# Patient Record
Sex: Male | Born: 2000 | Race: Black or African American | Hispanic: No | Marital: Single | Smoking: Never smoker
Health system: Southern US, Community
[De-identification: ages and names within clinical notes are randomized; demographics above are authoritative.]

## PROBLEM LIST (undated history)

## (undated) DIAGNOSIS — R000 Tachycardia, unspecified: Secondary | ICD-10-CM

## (undated) DIAGNOSIS — K219 Gastro-esophageal reflux disease without esophagitis: Secondary | ICD-10-CM

---
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
## 2020-03-23 ENCOUNTER — Ambulatory Visit: Payer: Medicaid Other | Admitting: Family Medicine

## 2020-03-24 ENCOUNTER — Encounter: Payer: Self-pay | Admitting: Family Medicine

## 2020-05-25 ENCOUNTER — Ambulatory Visit (INDEPENDENT_AMBULATORY_CARE_PROVIDER_SITE_OTHER): Payer: BC Managed Care – PPO | Admitting: Family Medicine

## 2020-05-25 ENCOUNTER — Encounter: Payer: Self-pay | Admitting: Family Medicine

## 2020-05-25 ENCOUNTER — Other Ambulatory Visit: Payer: Self-pay

## 2020-05-25 VITALS — BP 114/64 | HR 61 | Temp 97.9°F | Ht 66.5 in | Wt 155.2 lb

## 2020-05-25 DIAGNOSIS — M545 Low back pain: Secondary | ICD-10-CM

## 2020-05-25 DIAGNOSIS — G8929 Other chronic pain: Secondary | ICD-10-CM

## 2020-05-25 DIAGNOSIS — E739 Lactose intolerance, unspecified: Secondary | ICD-10-CM

## 2020-05-25 DIAGNOSIS — N50812 Left testicular pain: Secondary | ICD-10-CM

## 2020-05-25 DIAGNOSIS — K219 Gastro-esophageal reflux disease without esophagitis: Secondary | ICD-10-CM | POA: Diagnosis not present

## 2020-05-25 DIAGNOSIS — J4520 Mild intermittent asthma, uncomplicated: Secondary | ICD-10-CM | POA: Diagnosis not present

## 2020-05-25 MED ORDER — LANSOPRAZOLE 15 MG PO CPDR: 15.0000 mg | DELAYED_RELEASE_CAPSULE | Freq: Every day | ORAL | 3 refills | Status: DC

## 2020-05-25 MED ORDER — ALBUTEROL SULFATE HFA 108 (90 BASE) MCG/ACT IN AERS: 2.0000 | INHALATION_SPRAY | RESPIRATORY_TRACT | 5 refills | Status: DC | PRN

## 2020-05-25 MED ORDER — CETIRIZINE HCL 10 MG PO TABS: 10.0000 mg | ORAL_TABLET | Freq: Every day | ORAL | 3 refills | Status: DC

## 2020-05-25 NOTE — Patient Instructions (Addendum)
Acute Back Pain, Adult Acute back pain is sudden and usually short-lived. It is often caused by an injury to the muscles and tissues in the back. The injury may result from:  A muscle or ligament getting overstretched or torn (strained). Ligaments are tissues that connect bones to each other. Lifting something improperly can cause a back strain.  Wear and tear (degeneration) of the spinal disks. Spinal disks are circular tissue that provides cushioning between the bones of the spine (vertebrae).  Twisting motions, such as while playing sports or doing yard work.  A hit to the back.  Arthritis. You may have a physical exam, lab tests, and imaging tests to find the cause of your pain. Acute back pain usually goes away with rest and home care. Follow these instructions at home: Managing pain, stiffness, and swelling  Take over-the-counter and prescription medicines only as told by your health care provider.  Your health care provider may recommend applying ice during the first 24-48 hours after your pain starts. To do this: ? Put ice in a plastic bag. ? Place a towel between your skin and the bag. ? Leave the ice on for 20 minutes, 2-3 times a day.  If directed, apply heat to the affected area as often as told by your health care provider. Use the heat source that your health care provider recommends, such as a moist heat pack or a heating pad. ? Place a towel between your skin and the heat source. ? Leave the heat on for 20-30 minutes. ? Remove the heat if your skin turns bright red. This is especially important if you are unable to feel pain, heat, or cold. You have a greater risk of getting burned. Activity   Do not stay in bed. Staying in bed for more than 1-2 days can delay your recovery.  Sit up and stand up straight. Avoid leaning forward when you sit, or hunching over when you stand. ? If you work at a desk, sit close to it so you do not need to lean over. Keep your chin tucked  in. Keep your neck drawn back, and keep your elbows bent at a right angle. Your arms should look like the letter "L." ? Sit high and close to the steering wheel when you drive. Add lower back (lumbar) support to your car seat, if needed.  Take short walks on even surfaces as soon as you are able. Try to increase the length of time you walk each day.  Do not sit, drive, or stand in one place for more than 30 minutes at a time. Sitting or standing for long periods of time can put stress on your back.  Do not drive or use heavy machinery while taking prescription pain medicine.  Use proper lifting techniques. When you bend and lift, use positions that put less stress on your back: ? Bend your knees. ? Keep the load close to your body. ? Avoid twisting.  Exercise regularly as told by your health care provider. Exercising helps your back heal faster and helps prevent back injuries by keeping muscles strong and flexible.  Work with a physical therapist to make a safe exercise program, as recommended by your health care provider. Do any exercises as told by your physical therapist. Lifestyle  Maintain a healthy weight. Extra weight puts stress on your back and makes it difficult to have good posture.  Avoid activities or situations that make you feel anxious or stressed. Stress and anxiety increase muscle   tension and can make back pain worse. Learn ways to manage anxiety and stress, such as through exercise. General instructions  Sleep on a firm mattress in a comfortable position. Try lying on your side with your knees slightly bent. If you lie on your back, put a pillow under your knees.  Follow your treatment plan as told by your health care provider. This may include: ? Cognitive or behavioral therapy. ? Acupuncture or massage therapy. ? Meditation or yoga. Contact a health care provider if:  You have pain that is not relieved with rest or medicine.  You have increasing pain going down  into your legs or buttocks.  Your pain does not improve after 2 weeks.  You have pain at night.  You lose weight without trying.  You have a fever or chills. Get help right away if:  You develop new bowel or bladder control problems.  You have unusual weakness or numbness in your arms or legs.  You develop nausea or vomiting.  You develop abdominal pain.  You feel faint. Summary  Acute back pain is sudden and usually short-lived.  Use proper lifting techniques. When you bend and lift, use positions that put less stress on your back.  Take over-the-counter and prescription medicines and apply heat or ice as directed by your health care provider. This information is not intended to replace advice given to you by your health care provider. Make sure you discuss any questions you have with your health care provider. Document Revised: 12/08/2018 Document Reviewed: 04/02/2017 Elsevier Patient Education  2020 [employer] Lactose Intolerance, Adult Lactose is a natural sugar that is found in dairy milk and dairy products such as cheese and yogurt. Lactose is digested by lactase, a protein (enzyme) in your small intestine. Some people do not produce enough lactase to digest lactose. This is called lactose intolerance. Lactose intolerance is different from milk allergy, which is a more serious reaction to the protein in milk. What are the causes? Causes of lactose intolerance may include:  Normal aging. The ability to produce lactase may lessen with age, causing lactose intolerance over time.  Being born without the ability to make lactase.  Digestive diseases such as gastroenteritis or inflammatory bowel disease (IBD).  Surgery or injury to your small intestine.  Infection in your intestines.  Certain antibiotic medicines and cancer treatments. What are the signs or symptoms? Lactose intolerance can cause discomfort within 30 minutes to 2 hours after you eat or drink  something that contains lactose. Symptoms may include:  Nausea.  Diarrhea.  Cramps or pain in the abdomen.  A full, tight, or painful feeling in the abdomen (bloating).  Gas. How is this diagnosed? This condition may be diagnosed based on:  Your symptoms and medical history.  Lactose tolerance test. This test involves drinking a lactose solution and then having blood tests to measure the amount of glucose in your blood. If your blood glucose level does not go up, it means your body is not able to digest the lactose.  Lactose breath test (hydrogen breath test). This test involves drinking a lactose solution and then exhaling into a type of bag while you digest the solution. Having a lot of hydrogen in your breath can be a sign of lactose intolerance.  Stool acidity test. This involves drinking a lactose solution and then having your stool samples tested for bacteria. Having a lot of bacteria causes stool to be considered acidic, which is a sign of lactose intolerance. How  is this treated? There is no treatment to improve your body's ability to produce lactase. However, you can manage your symptoms at home by:  Limiting or avoiding dairy milk, dairy products, and other sources of lactose.  Taking lactase tablets when you eat milk products. Lactase tablets are over-the-counter medicines that help to improve lactose digestion. You may also add lactase drops to regular milk.  Adjusting your diet, such as drinking lactose-free milk. Lactose tolerance varies from person to person. Some people may be able to eat or drink small amounts of products that contain lactose, and other people may need to avoid all foods and drinks that contain lactose. Talk with your health care provider about what treatment is best for you. Follow these instructions at home:  Limit or avoid foods, beverages, and medicines that contain lactose, as told by your health care provider. Keep track of which foods,  beverages, or medicines cause symptoms so you can decide what to avoid in the future.  Read food and medicine labels carefully. Avoid products that contain: ? Lactose. ? Milk solids. ? Casein. ? Whey.  Take over-the-counter and prescription medicines (including lactase tablets) only as told by your health care provider.  If you stop eating and drinking dairy products (eliminate dairy from your diet), make sure to get enough protein, calcium, and vitamin D from other foods. Work with your health care provider or a diet and nutrition specialist (dietitian) to make sure you get enough of those nutrients.  Choose a milk substitute that is fortified with calcium and vitamin D. ? Soy milk contains high-quality protein. ? Milks that are made from nuts or grains (such as almond milk and rice milk) contain very small amounts of protein.  Keep all follow-up visits as told by your health care provider. This is important. Contact a health care provider if:  You have no relief from your symptoms after you have eliminated milk products and other sources of lactose. Get help right away if:  You have blood in your stool.  You have severe abdomen (abdominal) pain. Summary  Lactose is a natural sugar that is found in dairy milk and dairy products such as cheese and yogurt. Lactose is digested by lactase, which is a protein (enzyme) in the small intestine.  Some people do not produce enough lactase to digest lactose. This is called lactose intolerance.  Lactose intolerance can cause discomfort within 30 minutes to 2 hours after you eat or drink something that contains lactose.  Limit or avoid foods, beverages, and medicines that contain lactose, as told by your health care provider. This information is not intended to replace advice given to you by your health care provider. Make sure you discuss any questions you have with your health care provider. Document Revised: 08/01/2017 Document Reviewed:  04/04/2017 Elsevier Patient Education  2020 [employer].

## 2020-05-25 NOTE — Progress Notes (Signed)
Subjective: CC: back pain, GERD, asthma PCP: Mechele Claude, MD  MWN:[id number] A Benedick is a 19 y.o. male presenting to clinic today for:  1. Back pain Jacob Walker reports lower left sided back pain that occurs after eating dairy. He described the pain as a dull ache. The pain varies from mild to moderated. The pain is relieved with a bowel movement and/or passing gas. He denies stomach pain, nausea, vomiting, diarrhea, or constipation. He also reports left testicular pain that occurs with with the back pain. He is unable to describe the testicular pain, but report that it is milder than his back pain. He denies dysuria, frequency, or urgency, blood in urine, testicular swelling, penile pain or discharge. Denies fever. He is not sexually active. He reports that this has been going on for years and he was diagnosed as lactose intolerance as a child. He has been eating more daily lately so he has noticed an increase in symptoms. He has not tried lactaid tablets. He also has started working for [employer] and does a lot of lifting.  2. Acid reflux Jacob Walker reports that he used to take Prevacid that controlled his symptoms well, but has not taken the medication for the last two years. He reports indigestion, gas, and belching. Denies stomach acid, heartburn, or blood in his stool. Denies hoarseness, cough, or trouble swallowing.   3. Asthma Jacob Walker was dx with asthma as a child. He has not had an inhaler for awhile. He reports occasional shortness of breath that he feels is related to his allergies. He used to take Zyrtec but has not for a few years. He denies wheezing, chest tightness, or cough. Denies chest pain.   Relevant past medical, surgical, family, and social history reviewed and updated as indicated.  Allergies and medications reviewed and updated.  Allergies  Allergen Reactions  . Bee Venom Hives  . Lactose Intolerance (Gi)    Past Medical History:  Diagnosis Date  . Asthma   . GERD  (gastroesophageal reflux disease)   . Rapid heart beat     Current Outpatient Medications:  .  albuterol (VENTOLIN HFA) 108 (90 Base) MCG/ACT inhaler, Inhale 2 puffs into the lungs daily., Disp: 18 g, Rfl: 5 .  EPINEPHrine 0.3 mg/0.3 mL IJ SOAJ injection, Inject 0.3 mLs (0.3 mg total) into the muscle as needed for anaphylaxis., Disp: 2 Device, Rfl: 3 .  fluticasone (FLOVENT HFA) 44 MCG/ACT inhaler, Inhale 2 puffs into the lungs 2 (two) times a day., Disp: 1 Inhaler, Rfl: 12 .  lansoprazole (PREVACID) 15 MG capsule, Take 1 capsule (15 mg total) by mouth daily. (Patient not taking: Reported on 05/25/2020), Disp: 90 capsule, Rfl: 3 Social History   Socioeconomic History  . Marital status: Single    Spouse name: Not on file  . Number of children: Not on file  . Years of education: Not on file  . Highest education level: Not on file  Occupational History  . Not on file  Tobacco Use  . Smoking status: Passive Smoke Exposure - Never Smoker  . Smokeless tobacco: Never Used  Vaping Use  . Vaping Use: Never used  Substance and Sexual Activity  . Alcohol use: No  . Drug use: No  . Sexual activity: Not Currently  Other Topics Concern  . Not on file  Social History Narrative  . Not on file   Social Determinants of Health   Financial Resource Strain:   . Difficulty of Paying Living Expenses: Not on file  Food Insecurity:   . Worried About Programme researcher, broadcasting/film/video in the Last Year: Not on file  . Ran Out of Food in the Last Year: Not on file  Transportation Needs:   . Lack of Transportation (Medical): Not on file  . Lack of Transportation (Non-Medical): Not on file  Physical Activity:   . Days of Exercise per Week: Not on file  . Minutes of Exercise per Session: Not on file  Stress:   . Feeling of Stress : Not on file  Social Connections:   . Frequency of Communication with Friends and Family: Not on file  . Frequency of Social Gatherings with Friends and Family: Not on file  . Attends  Religious Services: Not on file  . Active Member of Clubs or Organizations: Not on file  . Attends Banker Meetings: Not on file  . Marital Status: Not on file  Intimate Partner Violence:   . Fear of Current or Ex-Partner: Not on file  . Emotionally Abused: Not on file  . Physically Abused: Not on file  . Sexually Abused: Not on file   Family History  Problem Relation Age of Onset  . Asthma Mother   . Allergies Mother   . Heart failure Mother   . Anemia Mother     Review of Systems  Per HPI.   Objective: Office vital signs reviewed. BP 114/64   Pulse 61   Temp 97.9 F (36.6 C) (Temporal)   Ht 5' 6.5" (1.689 m)   Wt 155 lb 4 oz (70.4 kg)   BMI 24.68 kg/m   Physical Examination:  Physical Exam Vitals and nursing note reviewed.  Constitutional:      General: He is not in acute distress.    Appearance: Normal appearance. He is not ill-appearing.  Cardiovascular:     Rate and Rhythm: Normal rate and regular rhythm.     Heart sounds: Normal heart sounds. No murmur heard.   Pulmonary:     Effort: Pulmonary effort is normal. No respiratory distress.     Breath sounds: Normal breath sounds. No wheezing.  Abdominal:     General: Abdomen is flat. There is no distension.     Palpations: Abdomen is soft. There is no mass.     Tenderness: There is no abdominal tenderness. There is no right CVA tenderness, left CVA tenderness, guarding or rebound.     Hernia: There is no hernia in the left inguinal area.  Genitourinary:    Pubic Area: No rash.      Penis: Normal and circumcised.      Testes: Normal. Cremasteric reflex is present.        Left: Mass, tenderness, swelling, testicular hydrocele or varicocele not present. Left testis is descended. Cremasteric reflex is present.      Epididymis:     Left: Normal. Not inflamed or enlarged. No mass or tenderness.     Comments: Negative Phren sign Musculoskeletal:     Lumbar back: Normal. No swelling, edema, deformity,  tenderness or bony tenderness. Normal range of motion. Negative right straight leg raise test and negative left straight leg raise test.     Right lower leg: No edema.     Left lower leg: No edema.  Lymphadenopathy:     Lower Body: No left inguinal adenopathy.  Skin:    General: Skin is warm and dry.  Neurological:     General: No focal deficit present.     Mental Status: He is alert  and oriented to person, place, and time.  Psychiatric:        Mood and Affect: Mood normal.        Behavior: Behavior normal.      Results for orders placed or performed during the hospital encounter of 05/16/11  Rapid strep screen   Specimen: Oral Mucosa/Gingiva; Throat  Result Value Ref Range   Streptococcus, Group A Screen (Direct) NEGATIVE NEGATIVE     Assessment/ Plan: Jacob Walker was seen today for back pain and gastroesophageal reflux, asthma, and back pain.  Diagnoses and all orders for this visit:  Lactose intolerance Avoid diary or take lactaid tablet prior to consuming dairy.   Gastroesophageal reflux disease without esophagitis No alarm signs today. Avoid trigger foods. Prevacid daily.  -     lansoprazole (PREVACID) 15 MG capsule; Take 1 capsule (15 mg total) by mouth daily.  Mild intermittent asthma without complication Asthma is intermittent and mild. SABA prn. If using SABA frequently or if symptoms worsen, will add ICS.  -     albuterol (VENTOLIN HFA) 108 (90 Base) MCG/ACT inhaler; Inhale 2 puffs into the lungs every 4 (four) hours as needed for wheezing or shortness of breath (shortness of breath, wheezing).  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 Albuterol prn.  -     albuterol (VENTOLIN HFA) 108 (90 Base) MCG/ACT inhaler; Inhale 2 puffs into the lungs every 4 (four) hours as needed for wheezing or shortness of breath (shortness of breath, wheezing). -     cetirizine (ZYRTEC) 10 MG tablet; Take 1 tablet (10 mg  total) by mouth daily.  Chronic left-sided low back pain without sciatica No concern for fracture today. Given patient's history back pain does seem to be due to lactose consumption. Avoid lactose or use lactaid tablets. Discussed advil, gas-x, heat for pain. Handout given with back stretches. Discussed proper lifting for work. Discussed back brace. Follow up if symptoms do no improve with avoidance of dairy.   Left testicular pain Exam normal. No concern for testicular torsion today. No concern for STDs or incarcerated hernia. Patient declined UA today. No urinary symptoms. Pain does appear to be related to back pain with diary ingestion. Follow up if no improvement with avoiding dairy.   Follow up in 1 month to reassess and for CPE, sooner if new or worsening symptoms, or if symptoms persist.   The above assessment and management plan was discussed with the patient. The patient verbalized understanding of and has agreed to the management plan. Patient is aware to call the clinic if symptoms persist or worsen. Patient is aware when to return to the clinic for a follow-up visit. Patient educated on when it is appropriate to go to the emergency department.   Harlow Mares, FNP-C [facility] [address] [place], [place] [address] [phone]

## 2020-06-19 DIAGNOSIS — R0989 Other specified symptoms and signs involving the circulatory and respiratory systems: Secondary | ICD-10-CM | POA: Diagnosis not present

## 2020-06-19 DIAGNOSIS — R432 Parageusia: Secondary | ICD-10-CM | POA: Diagnosis not present

## 2020-06-19 DIAGNOSIS — R0981 Nasal congestion: Secondary | ICD-10-CM | POA: Diagnosis not present

## 2020-06-19 DIAGNOSIS — U071 COVID-19: Secondary | ICD-10-CM | POA: Diagnosis not present

## 2020-06-19 DIAGNOSIS — R059 Cough, unspecified: Secondary | ICD-10-CM | POA: Diagnosis not present

## 2020-06-19 DIAGNOSIS — R430 Anosmia: Secondary | ICD-10-CM | POA: Diagnosis not present

## 2020-06-27 ENCOUNTER — Ambulatory Visit: Payer: BC Managed Care – PPO | Admitting: Family Medicine

## 2020-06-28 ENCOUNTER — Encounter: Payer: Self-pay | Admitting: Family Medicine

## 2021-10-23 ENCOUNTER — Encounter: Payer: Self-pay | Admitting: Family Medicine

## 2021-10-23 ENCOUNTER — Other Ambulatory Visit: Payer: Self-pay | Admitting: *Deleted

## 2021-10-23 DIAGNOSIS — K219 Gastro-esophageal reflux disease without esophagitis: Secondary | ICD-10-CM

## 2021-10-23 DIAGNOSIS — J4520 Mild intermittent asthma, uncomplicated: Secondary | ICD-10-CM

## 2021-10-23 MED ORDER — FLUTICASONE PROPIONATE HFA 44 MCG/ACT IN AERO: 2.0000 | INHALATION_SPRAY | Freq: Two times a day (BID) | RESPIRATORY_TRACT | 12 refills | Status: DC

## 2021-10-23 MED ORDER — ALBUTEROL SULFATE HFA 108 (90 BASE) MCG/ACT IN AERS: 2.0000 | INHALATION_SPRAY | RESPIRATORY_TRACT | 5 refills | Status: DC | PRN

## 2021-10-23 MED ORDER — LANSOPRAZOLE 15 MG PO CPDR: 15.0000 mg | DELAYED_RELEASE_CAPSULE | Freq: Every day | ORAL | 3 refills | Status: DC

## 2021-11-22 ENCOUNTER — Ambulatory Visit (INDEPENDENT_AMBULATORY_CARE_PROVIDER_SITE_OTHER): Payer: BC Managed Care – PPO | Admitting: Family Medicine

## 2021-11-22 ENCOUNTER — Encounter: Payer: Self-pay | Admitting: Family Medicine

## 2021-11-22 VITALS — BP 120/67 | HR 74 | Temp 98.6°F | Ht 66.5 in | Wt 161.5 lb

## 2021-11-22 DIAGNOSIS — R0989 Other specified symptoms and signs involving the circulatory and respiratory systems: Secondary | ICD-10-CM | POA: Diagnosis not present

## 2021-11-22 DIAGNOSIS — Z91030 Bee allergy status: Secondary | ICD-10-CM

## 2021-11-22 DIAGNOSIS — K219 Gastro-esophageal reflux disease without esophagitis: Secondary | ICD-10-CM

## 2021-11-22 DIAGNOSIS — J4520 Mild intermittent asthma, uncomplicated: Secondary | ICD-10-CM

## 2021-11-22 MED ORDER — FAMOTIDINE 20 MG PO TABS: 20.0000 mg | ORAL_TABLET | Freq: Two times a day (BID) | ORAL | 3 refills | Status: DC

## 2021-11-22 MED ORDER — EPINEPHRINE 0.3 MG/0.3ML IJ SOAJ: 0.3000 mg | INTRAMUSCULAR | 3 refills | Status: DC | PRN

## 2021-11-22 NOTE — Patient Instructions (Signed)
Food Choices for Gastroesophageal Reflux Disease, Adult °When you have gastroesophageal reflux disease (GERD), the foods you eat and your eating habits are very important. Choosing the right foods can help ease the discomfort of GERD. Consider working with a dietitian to help you make healthy food choices. °What are tips for following this plan? °Reading food labels °Look for foods that are low in saturated fat. Foods that have less than 5% of daily value (DV) of fat and 0 g of trans fats may help with your symptoms. °Cooking °Cook foods using methods other than frying. This may include baking, steaming, grilling, or broiling. These are all methods that do not need a lot of fat for cooking. °To add flavor, try to use herbs that are low in spice and acidity. °Meal planning ° °Choose healthy foods that are low in fat, such as fruits, vegetables, whole grains, low-fat dairy products, lean meats, fish, and poultry. °Eat frequent, small meals instead of three large meals each day. Eat your meals slowly, in a relaxed setting. Avoid bending over or lying down until 2-3 hours after eating. °Limit high-fat foods such as fatty meats or fried foods. °Limit your intake of fatty foods, such as oils, butter, and shortening. °Avoid the following as told by your health care provider: °Foods that cause symptoms. These may be different for different people. Keep a food diary to keep track of foods that cause symptoms. °Alcohol. °Drinking large amounts of liquid with meals. °Eating meals during the 2-3 hours before bed. °Lifestyle °Maintain a healthy weight. Ask your health care provider what weight is healthy for you. If you need to lose weight, work with your health care provider to do so safely. °Exercise for at least 30 minutes on 5 or more days each week, or as told by your health care provider. °Avoid wearing clothes that fit tightly around your waist and chest. °Do not use any products that contain nicotine or tobacco. These  products include cigarettes, chewing tobacco, and vaping devices, such as e-cigarettes. If you need help quitting, ask your health care provider. °Sleep with the head of your bed raised. Use a wedge under the mattress or blocks under the bed frame to raise the head of the bed. °Chew sugar-free gum after mealtimes. °What foods should I eat? °Eat a healthy, well-balanced diet of fruits, vegetables, whole grains, low-fat dairy products, lean meats, fish, and poultry. Each person is different. Foods that may trigger symptoms in one person may not trigger any symptoms in another person. Work with your health care provider to identify foods that are safe for you. °The items listed above may not be a complete list of recommended foods and beverages. Contact a dietitian for more information. °What foods should I avoid? °Limiting some of these foods may help manage the symptoms of GERD. Everyone is different. Consult a dietitian or your health care provider to help you identify the exact foods to avoid, if any. °Fruits °Any fruits prepared with added fat. Any fruits that cause symptoms. For some people this may include citrus fruits, such as oranges, grapefruit, pineapple, and lemons. °Vegetables °Deep-fried vegetables. French fries. Any vegetables prepared with added fat. Any vegetables that cause symptoms. For some people, this may include tomatoes and tomato products, chili peppers, onions and garlic, and horseradish. °Grains °Pastries or quick breads with added fat. °Meats and other proteins °High-fat meats, such as fatty beef or pork, hot dogs, ribs, ham, sausage, salami, and bacon. Fried meat or protein, including fried   fish and fried chicken. Nuts and nut butters, in large amounts. °Dairy °Whole milk and chocolate milk. Sour cream. Cream. Ice cream. Cream cheese. Milkshakes. °Fats and oils °Butter. Margarine. Shortening. Ghee. °Beverages °Coffee and tea, with or without caffeine. Carbonated beverages. Sodas. Energy  drinks. Fruit juice made with acidic fruits, such as orange or grapefruit. Tomato juice. Alcoholic drinks. °Sweets and desserts °Chocolate and cocoa. Donuts. °Seasonings and condiments °Pepper. Peppermint and spearmint. Added salt. Any condiments, herbs, or seasonings that cause symptoms. For some people, this may include curry, hot sauce, or vinegar-based salad dressings. °The items listed above may not be a complete list of foods and beverages to avoid. Contact a dietitian for more information. °Questions to ask your health care provider °Diet and lifestyle changes are usually the first steps that are taken to manage symptoms of GERD. If diet and lifestyle changes do not improve your symptoms, talk with your health care provider about taking medicines. °Where to find more information °[employer] Gastrointestinal Disorders: aboutgerd.org °Summary °When you have gastroesophageal reflux disease (GERD), food and lifestyle choices may be very helpful in easing the discomfort of GERD. °Eat frequent, small meals instead of three large meals each day. Eat your meals slowly, in a relaxed setting. Avoid bending over or lying down until 2-3 hours after eating. °Limit high-fat foods such as fatty meats or fried foods. °This information is not intended to replace advice given to you by your health care provider. Make sure you discuss any questions you have with your health care provider. °Document Revised: 02/28/2020 Document Reviewed: 02/28/2020 °Elsevier Patient Education © 2022 [employer] ° °

## 2021-11-22 NOTE — Progress Notes (Signed)
? ?Acute Office Visit ? ?Subjective:  ? ? Patient ID: Jacob Walker, male    DOB: September 06, 2000, 21 y.o.   MRN: [id number] ? ?Chief Complaint  ?Patient presents with  ? Hernia  ? ? ?HPI ?Patient is in today for a hernia on his left side, in the inguinal area.He started noticing pain there 2 days ago that comes and goes. It is tender to the touch. It is mild. He does lift weights and denies pain when doing this. He denies changes in bowel habits, fever, chills, vomiting, urinary symptoms, testicular pain, penile pain, discharge, or swelling. He noticed one on the right side about 1 month ago but this is no longer there. He does shave his pubic hair.  ? ?He has not been taking medications for GERD. He reports regurgitation and heart burn that is intermittent. He denies weight loss. He does not like to take medications and would prefer something that he can take prn.  ? ?He needs a refill on his epi pen. He is allergic to bees. The epi pen was lost during a move.  ? ?He reports asthma is well controlled on flovent. He rarely uses albuterol. Denies shortness of breath or wheezing.  ?  ? ?Past Medical History:  ?Diagnosis Date  ? Asthma   ? GERD (gastroesophageal reflux disease)   ? Rapid heart beat   ? ? ?No past surgical history on file. ? ?Family History  ?Problem Relation Age of Onset  ? Asthma Mother   ? Allergies Mother   ? Heart failure Mother   ? Anemia Mother   ? ? ?Social History  ? ?Socioeconomic History  ? Marital status: Single  ?  Spouse name: Not on file  ? Number of children: Not on file  ? Years of education: Not on file  ? Highest education level: Not on file  ?Occupational History  ? Not on file  ?Tobacco Use  ? Smoking status: Never  ?  Passive exposure: Yes  ? Smokeless tobacco: Never  ?Vaping Use  ? Vaping Use: Never used  ?Substance and Sexual Activity  ? Alcohol use: No  ? Drug use: No  ? Sexual activity: Not Currently  ?Other Topics Concern  ? Not on file  ?Social History Narrative  ? Not on file   ? ?Social Determinants of Health  ? ?Financial Resource Strain: Not on file  ?Food Insecurity: Not on file  ?Transportation Needs: Not on file  ?Physical Activity: Not on file  ?Stress: Not on file  ?Social Connections: Not on file  ?Intimate Partner Violence: Not on file  ? ? ?Outpatient Medications Prior to Visit  ?Medication Sig Dispense Refill  ? albuterol (VENTOLIN HFA) 108 (90 Base) MCG/ACT inhaler Inhale 2 puffs into the lungs every 4 (four) hours as needed for wheezing or shortness of breath (shortness of breath, wheezing). 18 g 5  ? cetirizine (ZYRTEC) 10 MG tablet Take 1 tablet (10 mg total) by mouth daily. 90 tablet 3  ? fluticasone (FLOVENT HFA) 44 MCG/ACT inhaler Inhale 2 puffs into the lungs 2 (two) times daily. 1 each 12  ? lansoprazole (PREVACID) 15 MG capsule Take 1 capsule (15 mg total) by mouth daily. 90 capsule 3  ? EPINEPHrine 0.3 mg/0.3 mL IJ SOAJ injection Inject 0.3 mLs (0.3 mg total) into the muscle as needed for anaphylaxis. (Patient not taking: Reported on 11/22/2021) 2 Device 3  ? ?No facility-administered medications prior to visit.  ? ? ?Allergies  ?Allergen Reactions  ?  Bee Venom Hives  ? Lactose Intolerance (Gi)   ? ? ?Review of Systems ?As per HPI.  ?   ?Objective:  ?  ?Physical Exam ?Vitals and nursing note reviewed.  ?Constitutional:   ?   General: He is not in acute distress. ?   Appearance: He is not ill-appearing, toxic-appearing or diaphoretic.  ?Cardiovascular:  ?   Rate and Rhythm: Normal rate and regular rhythm.  ?   Heart sounds: Normal heart sounds. No murmur heard. ?Pulmonary:  ?   Effort: Pulmonary effort is normal. No respiratory distress.  ?   Breath sounds: Normal breath sounds.  ?Abdominal:  ?   General: Bowel sounds are normal. There is no distension.  ?   Palpations: Abdomen is soft.  ?   Tenderness: There is no abdominal tenderness. There is no right CVA tenderness, left CVA tenderness, guarding or rebound.  ?   Hernia: No hernia is present. There is no hernia in  the umbilical area, left inguinal area or right inguinal area.  ?Genitourinary: ?   Penis: Normal. No tenderness or swelling.   ?   Testes: Normal. Cremasteric reflex is present.  ?   Epididymis:  ?   Right: Normal.  ?   Left: Normal.  ?Musculoskeletal:  ?   Right lower leg: No edema.  ?   Left lower leg: No edema.  ?Lymphadenopathy:  ?   Lower Body: No right inguinal adenopathy. Left inguinal adenopathy (single tender left inguinal lymph node) present.  ?Skin: ?   General: Skin is warm and dry.  ?Neurological:  ?   Mental Status: He is alert and oriented to person, place, and time.  ?   Motor: No weakness.  ?   Gait: Gait normal.  ?Psychiatric:     ?   Mood and Affect: Mood normal.     ?   Behavior: Behavior normal.  ? ? ?BP 120/67   Pulse 74   Temp 98.6 ?F (37 ?C) (Temporal)   Ht 5' 6.5" (1.689 m)   Wt 161 lb 8 oz (73.3 kg)   BMI 25.68 kg/m?  ?Wt Readings from Last 3 Encounters:  ?11/22/21 161 lb 8 oz (73.3 kg)  ?05/25/20 155 lb 4 oz (70.4 kg) (54 %, Z= 0.10)*  ?02/10/19 158 lb 3.2 oz (71.8 kg) (67 %, Z= 0.43)*  ? ?* Growth percentiles are based on CDC (Boys, 2-20 Years) data.  ? ? ?Health Maintenance Due  ?Topic Date Due  ? COVID-19 Vaccine (1) Never done  ? HIV Screening  Never done  ? Hepatitis C Screening  Never done  ? TETANUS/TDAP  04/25/2021  ? ? ?There are no preventive care reminders to display for this patient. ? ? ?No results found for: TSH ?No results found for: WBC, HGB, HCT, MCV, PLT ?No results found for: NA, K, CHLORIDE, CO2, GLUCOSE, BUN, CREATININE, BILITOT, ALKPHOS, AST, ALT, PROT, ALBUMIN, CALCIUM, ANIONGAP, EGFR, GFR ?No results found for: CHOL ?No results found for: HDL ?No results found for: [place] ?No results found for: TRIG ?No results found for: CHOLHDL ?No results found for: HGBA1C ? ?   ?Assessment & Plan:  ? ?Jacob Walker was seen today for hernia. ? ?Diagnoses and all orders for this visit: ? ?Tenderness of lymph node ?Discussed area that his is palpating is a lymph node. Discussed  that this is likely momentarily tender due to shaving. Discussed strict return precautions.  ? 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 Continue flovent daily and albuterol prn.  ? ?Bee sting allergy ?Refill provided.  ?-     EPINEPHrine 0.3 mg/0.3 mL IJ SOAJ injection; Inject 0.3 mg into the muscle as needed for anaphylaxis. ? ?Gastroesophageal reflux disease without esophagitis ?Not well controlled. Discussed compliance with medications. Handout given for diet. Will try pepcid that can be used prn as patient prefers not to take medication daily.  ?-     famotidine (PEPCID) 20 MG tablet; Take 1 tablet (20 mg total) by mouth 2 (two) times daily. ? ?Return in about 6 months (around 05/25/2022) for CPE. ? ?The patient indicates understanding of these issues and agrees with the plan. ? ?Gwenlyn Perking, FNP ? ?

## 2021-12-31 ENCOUNTER — Encounter: Payer: Self-pay | Admitting: Family Medicine

## 2022-02-15 DIAGNOSIS — H5213 Myopia, bilateral: Secondary | ICD-10-CM | POA: Diagnosis not present

## 2022-02-28 ENCOUNTER — Other Ambulatory Visit: Payer: Self-pay | Admitting: *Deleted

## 2022-02-28 DIAGNOSIS — J4520 Mild intermittent asthma, uncomplicated: Secondary | ICD-10-CM

## 2022-02-28 MED ORDER — CETIRIZINE HCL 10 MG PO TABS: 10.0000 mg | ORAL_TABLET | Freq: Every day | ORAL | 3 refills | Status: DC

## 2022-03-04 ENCOUNTER — Other Ambulatory Visit: Payer: Self-pay

## 2022-03-04 DIAGNOSIS — J4520 Mild intermittent asthma, uncomplicated: Secondary | ICD-10-CM

## 2022-03-04 MED ORDER — CETIRIZINE HCL 10 MG PO TABS: 10.0000 mg | ORAL_TABLET | Freq: Every day | ORAL | 2 refills | Status: DC

## 2022-03-27 ENCOUNTER — Other Ambulatory Visit: Payer: Self-pay | Admitting: Family Medicine

## 2022-03-27 ENCOUNTER — Telehealth: Payer: Self-pay | Admitting: Family Medicine

## 2022-03-27 ENCOUNTER — Other Ambulatory Visit: Payer: Self-pay | Admitting: Nurse Practitioner

## 2022-03-27 DIAGNOSIS — J4530 Mild persistent asthma, uncomplicated: Secondary | ICD-10-CM

## 2022-03-27 DIAGNOSIS — J4520 Mild intermittent asthma, uncomplicated: Secondary | ICD-10-CM

## 2022-03-27 NOTE — Telephone Encounter (Signed)
Pharmacy comment: Alternative Requested:INSURANCE NO LONGER COVERS FLOVENT. PLEASE RESEND FOR QVAR OR PULMICORT. THANKS!

## 2022-03-28 NOTE — Telephone Encounter (Signed)
Pharmacy comment: Alternative Requested:IM SO SORRY, NOW INSURANCE SAYS THEY ONLY COVER QVAR FOR PATIENT UNDER AGE OF 5.... NOW THEY SAY PULMICORT IS PREFERRED.

## 2022-05-24 ENCOUNTER — Ambulatory Visit: Payer: BC Managed Care – PPO | Admitting: Family Medicine

## 2022-05-24 ENCOUNTER — Encounter: Payer: BC Managed Care – PPO | Admitting: Family Medicine

## 2022-05-27 ENCOUNTER — Encounter: Payer: Self-pay | Admitting: Family Medicine

## 2022-07-05 ENCOUNTER — Ambulatory Visit: Payer: BC Managed Care – PPO | Admitting: Family Medicine

## 2022-08-13 ENCOUNTER — Telehealth: Payer: Self-pay | Admitting: Physician Assistant

## 2022-08-13 ENCOUNTER — Encounter: Payer: Self-pay | Admitting: Family Medicine

## 2022-08-13 DIAGNOSIS — H9201 Otalgia, right ear: Secondary | ICD-10-CM

## 2022-08-13 MED ORDER — AMOXICILLIN 875 MG PO TABS: 875.0000 mg | ORAL_TABLET | Freq: Two times a day (BID) | ORAL | 0 refills | Status: DC

## 2022-08-13 NOTE — Progress Notes (Signed)
I have spent 5 minutes in review of e-visit questionnaire, review and updating patient chart, medical decision making and response to patient.   Luv Mish Cody Marcio Hoque, PA-C    

## 2022-08-13 NOTE — Progress Notes (Signed)
E-Visit for Ear Pain - Acute Otitis Media   We are sorry that you are not feeling well. Here is how we plan to help!  Based on what you have shared with me it looks like you have Acute Otitis Media.  Acute Otitis Media is an infection of the middle or "inner" ear. This type of infection can cause redness, inflammation, and fluid buildup behind the tympanic membrane (ear drum).  The usual symptoms include: Earache/Pain Fever Upper respiratory symptoms Lack of energy/Fatigue/Malaise Slight hearing loss gradually worsening- if the inner ear fills with fluid What causes middle ear infections? Most middle ear infections occur when an infection such as a cold, leads to a build-up of mucus in the middle ear and causes the Eustachian tube (a thin tube that runs from the middle ear to the back of the nose) to become swollen or blocked.   This means mucus can't drain away properly, making it easier for an infection to spread into the middle ear.  How middle ear infections are treated: Most ear infections clear up within three to five days and don't need any specific treatment. If necessary, tylenol or ibuprofen should be used to relieve pain and a high temperature.  If you develop a fever higher than 102, or any significantly worsening symptoms, this could indicate a more serious infection moving to the middle/inner and needs face to face evaluation in an office by a provider.   Antibiotics aren't routinely used to treat middle ear infections, although they may occasionally be prescribed if symptoms persist or are particularly severe. Given your presentation,   I have prescribed Amoxicillin 875 mg one tablet twice daily for 10 days Start OTC nasal steroid like Flonase or Nasacort daily.   Your symptoms should improve over the next 3 days and should resolve in about 7 days. Be sure to complete ALL of the prescription(s) given.  HOME CARE: Wash your hands frequently. If you are prescribed an ear  drop, do not place the tip of the bottle on your ear or touch it with your fingers. You can take Acetaminophen 650 mg every 4-6 hours as needed for pain.  If pain is severe or moderate, you can apply a heating pad (set on low) or hot water bottle (wrapped in a towel) to outer ear for 20 minutes.  This will also increase drainage.  GET HELP RIGHT AWAY IF: Fever is over 102.2 degrees. You develop progressive ear pain or hearing loss. Ear symptoms persist longer than 3 days after treatment.  MAKE SURE YOU: Understand these instructions. Will watch your condition. Will get help right away if you are not doing well or get worse.  Thank you for choosing an e-visit.  Your e-visit answers were reviewed by a board certified advanced clinical practitioner to complete your personal care plan. Depending upon the condition, your plan could have included both over the counter or prescription medications.  Please review your pharmacy choice. Make sure the pharmacy is open so you can pick up the prescription now. If there is a problem, you may contact your provider through [employer] and have the prescription routed to another pharmacy.  Your safety is important to [place]. If you have drug allergies check your prescription carefully.   For the next 24 hours you can use MyChart to ask questions about today's visit, request a non-urgent call back, or ask for a work or school excuse. You will get an email with a survey after your eVisit asking about  your experience. We would appreciate your feedback. I hope that your e-visit has been valuable and will aid in your recovery.

## 2022-08-29 ENCOUNTER — Ambulatory Visit: Payer: BC Managed Care – PPO | Admitting: Family Medicine

## 2022-11-20 ENCOUNTER — Encounter: Payer: Self-pay | Admitting: Family Medicine

## 2022-11-20 ENCOUNTER — Other Ambulatory Visit: Payer: Self-pay | Admitting: *Deleted

## 2022-11-20 DIAGNOSIS — K219 Gastro-esophageal reflux disease without esophagitis: Secondary | ICD-10-CM

## 2022-11-20 MED ORDER — LANSOPRAZOLE 15 MG PO CPDR: 15.0000 mg | DELAYED_RELEASE_CAPSULE | Freq: Every day | ORAL | 0 refills | Status: DC

## 2022-12-03 ENCOUNTER — Other Ambulatory Visit: Payer: Self-pay | Admitting: Family Medicine

## 2022-12-03 DIAGNOSIS — K219 Gastro-esophageal reflux disease without esophagitis: Secondary | ICD-10-CM

## 2022-12-05 ENCOUNTER — Other Ambulatory Visit: Payer: Self-pay | Admitting: Family Medicine

## 2022-12-05 DIAGNOSIS — K219 Gastro-esophageal reflux disease without esophagitis: Secondary | ICD-10-CM

## 2022-12-06 ENCOUNTER — Encounter: Payer: Self-pay | Admitting: Family Medicine

## 2022-12-06 ENCOUNTER — Telehealth: Payer: Self-pay

## 2022-12-06 NOTE — Telephone Encounter (Signed)
Mailbox is full attempted to call

## 2022-12-16 ENCOUNTER — Ambulatory Visit: Payer: BC Managed Care – PPO | Admitting: Family Medicine

## 2023-03-14 ENCOUNTER — Encounter: Payer: BC Managed Care – PPO | Admitting: Family Medicine

## 2023-04-14 ENCOUNTER — Telehealth: Payer: Self-pay

## 2023-04-14 ENCOUNTER — Telehealth: Payer: Self-pay | Admitting: Family Medicine

## 2023-04-14 NOTE — Telephone Encounter (Signed)
Ok with me 

## 2023-04-14 NOTE — Telephone Encounter (Signed)
okay

## 2023-04-14 NOTE — Transitions of Care (Post Inpatient/ED Visit) (Signed)
   04/14/2023  Name: Jacob Walker MRN: [id number] DOB: 2000/11/19  Today's TOC FU Call Status: Today's TOC FU Call Status:: Unsuccessful Call (1st Attempt) Unsuccessful Call (1st Attempt) Date: 04/14/23  Attempted to reach the patient regarding the most recent Inpatient/ED visit.  Follow Up Plan: Additional outreach attempts will be made to reach the patient to complete the Transitions of Care (Post Inpatient/ED visit) call.  Karena Addison, LPN [facility] Nurse Health Advisor Direct Dial [phone]  Signature Karena Addison, LPN [facility] Nurse Health Advisor Direct Dial [phone]

## 2023-04-16 NOTE — Telephone Encounter (Signed)
Left message for pt to call back to schedule 30 min appt with Dr Darlyn Read to est care.

## 2023-04-23 NOTE — Transitions of Care (Post Inpatient/ED Visit) (Unsigned)
   04/23/2023  Name: BEAUFORD ROERIG MRN: [id number] DOB: 19-Jan-2001  Today's TOC FU Call Status: Today's TOC FU Call Status:: Unsuccessful Call (2nd Attempt) Unsuccessful Call (1st Attempt) Date: 04/14/23 Unsuccessful Call (2nd Attempt) Date: 04/23/23  Attempted to reach the patient regarding the most recent Inpatient/ED visit.  Follow Up Plan: Additional outreach attempts will be made to reach the patient to complete the Transitions of Care (Post Inpatient/ED visit) call.   Signature Karena Addison, LPN [facility] Nurse Health Advisor Direct Dial [phone]

## 2023-04-24 NOTE — Transitions of Care (Post Inpatient/ED Visit) (Signed)
   04/24/2023  Name: Jacob Walker MRN: [id number] DOB: 26-Jan-2001  Today's TOC FU Call Status: Today's TOC FU Call Status:: Unsuccessful Call (3rd Attempt) Unsuccessful Call (1st Attempt) Date: 04/14/23 Unsuccessful Call (2nd Attempt) Date: 04/23/23 Unsuccessful Call (3rd Attempt) Date: 04/24/23  Attempted to reach the patient regarding the most recent Inpatient/ED visit.  Follow Up Plan: No further outreach attempts will be made at this time. We have been unable to contact the patient.  Signature Karena Addison, LPN [facility] Nurse Health Advisor Direct Dial [phone]

## 2023-05-07 ENCOUNTER — Encounter: Payer: BC Managed Care – PPO | Admitting: Family Medicine

## 2023-06-05 ENCOUNTER — Ambulatory Visit (INDEPENDENT_AMBULATORY_CARE_PROVIDER_SITE_OTHER): Payer: No Typology Code available for payment source | Admitting: Family Medicine

## 2023-06-05 ENCOUNTER — Encounter: Payer: BC Managed Care – PPO | Admitting: Family Medicine

## 2023-06-05 VITALS — BP 106/59 | HR 63 | Temp 97.6°F | Ht 66.5 in | Wt 177.6 lb

## 2023-06-05 DIAGNOSIS — J309 Allergic rhinitis, unspecified: Secondary | ICD-10-CM | POA: Diagnosis not present

## 2023-06-05 DIAGNOSIS — J4540 Moderate persistent asthma, uncomplicated: Secondary | ICD-10-CM | POA: Diagnosis not present

## 2023-06-05 NOTE — Progress Notes (Signed)
Subjective:  Patient ID: Jacob Walker, male    DOB: June 06, 2001  Age: 22 y.o. MRN: [id number]  CC: Establish Care   HPI Jacob Walker presents for Recent breathing problems. Wheezing. History of asthma. Onset as a child. Had hoped he would outgrow it.      06/05/2023    1:34 PM 06/05/2023    1:13 PM 11/22/2021   10:24 AM  Depression screen PHQ 2/9  Decreased Interest 0 0 0  Down, Depressed, Hopeless 0 0 0  PHQ - 2 Score 0 0 0  Altered sleeping 0  0  Tired, decreased energy 2  0  Change in appetite 1  0  Feeling bad or failure about yourself  0  0  Trouble concentrating 1  0  Moving slowly or fidgety/restless 0  0  Suicidal thoughts 0  0  PHQ-9 Score 4  0  Difficult doing work/chores Not difficult at all  Not difficult at all    History Jacob Walker has a past medical history of Asthma, GERD (gastroesophageal reflux disease), and Rapid heart beat.   He has no past surgical history on file.   His family history includes Allergies in his mother; Anemia in his mother; Asthma in his mother; Heart failure in his mother.He reports that he has never smoked. He has been exposed to tobacco smoke. He has never used smokeless tobacco. He reports that he does not drink alcohol and does not use drugs.    ROS Review of Systems  Constitutional: Negative.   HENT: Negative.    Eyes:  Negative for visual disturbance.  Respiratory:  Negative for cough and shortness of breath.   Cardiovascular:  Negative for chest pain and leg swelling.  Gastrointestinal:  Negative for abdominal pain, diarrhea, nausea and vomiting.  Genitourinary:  Negative for difficulty urinating.  Musculoskeletal:  Negative for arthralgias and myalgias.  Skin:  Negative for rash.  Neurological:  Negative for headaches.  Psychiatric/Behavioral:  Negative for sleep disturbance.     Objective:  BP (!) 106/59   Pulse 63   Temp 97.6 F (36.4 C)   Ht 5' 6.5" (1.689 m)   Wt 177 lb 9.6 oz (80.6 kg)   SpO2 98%   BMI  28.24 kg/m   BP Readings from Last 3 Encounters:  06/05/23 (!) 106/59  11/22/21 120/67  05/25/20 114/64    Wt Readings from Last 3 Encounters:  06/05/23 177 lb 9.6 oz (80.6 kg)  11/22/21 161 lb 8 oz (73.3 kg)  05/25/20 155 lb 4 oz (70.4 kg) (54%, Z= 0.10)*   * Growth percentiles are based on CDC (Boys, 2-20 Years) data.     Physical Exam Constitutional:      General: He is not in acute distress.    Appearance: He is well-developed.  HENT:     Head: Normocephalic and atraumatic.     Right Ear: External ear normal.     Left Ear: External ear normal.     Nose: Nose normal.  Eyes:     Conjunctiva/sclera: Conjunctivae normal.     Pupils: Pupils are equal, round, and reactive to light.  Cardiovascular:     Rate and Rhythm: Normal rate and regular rhythm.     Heart sounds: Normal heart sounds. No murmur heard. Pulmonary:     Effort: Pulmonary effort is normal. No respiratory distress.     Breath sounds: Normal breath sounds. No wheezing or rales.  Abdominal:     Palpations: Abdomen is soft.  Tenderness: There is no abdominal tenderness.  Musculoskeletal:        General: Normal range of motion.     Cervical back: Normal range of motion and neck supple.  Skin:    General: Skin is warm and dry.  Neurological:     Mental Status: He is alert and oriented to person, place, and time.     Deep Tendon Reflexes: Reflexes are normal and symmetric.  Psychiatric:        Behavior: Behavior normal.        Thought Content: Thought content normal.        Judgment: Judgment normal.       Assessment & Plan:   Jacob Walker was seen today for establish care.  Diagnoses and all orders for this visit:  Moderate persistent asthma without complication -     CBC with Differential/Platelet -     CMP14+EGFR -     Lipid panel -     Urinalysis  Allergic rhinitis, unspecified seasonality, unspecified trigger       I have discontinued Jane A. Segers's cetirizine and famotidine. I  am also having him maintain his albuterol, EPINEPHrine, Pulmicort Flexhaler, amoxicillin, lansoprazole, and fexofenadine.  Allergies as of 06/05/2023       Reactions   Bee Venom Hives   Lactose Intolerance (gi)         Medication List        Accurate as of June 05, 2023 11:59 PM. If you have any questions, ask your nurse or doctor.          STOP taking these medications    cetirizine 10 MG tablet Commonly known as: ZYRTEC Stopped by: Shabree Tebbetts   famotidine 20 MG tablet Commonly known as: PEPCID Stopped by: Andora Krull       TAKE these medications    albuterol 108 (90 Base) MCG/ACT inhaler Commonly known as: VENTOLIN HFA Inhale 2 puffs into the lungs every 4 (four) hours as needed for wheezing or shortness of breath (shortness of breath, wheezing).   amoxicillin 875 MG tablet Commonly known as: AMOXIL Take 1 tablet (875 mg total) by mouth 2 (two) times daily.   EPINEPHrine 0.3 mg/0.3 mL Soaj injection Commonly known as: EPI-PEN Inject 0.3 mg into the muscle as needed for anaphylaxis.   fexofenadine 180 MG tablet Commonly known as: ALLEGRA Take 180 mg by mouth daily.   lansoprazole 15 MG capsule Commonly known as: PREVACID Take 1 capsule (15 mg total) by mouth daily. (NEEDS TO BE SEEN BEFORE NEXT REFILL)   Pulmicort Flexhaler 90 MCG/ACT inhaler Generic drug: Budesonide Inhale 1-2 puffs into the lungs 2 (two) times daily.         Follow-up: Return in about 6 months (around 12/04/2023) for asthma.  Mechele Claude, M.D.

## 2023-06-08 ENCOUNTER — Encounter: Payer: Self-pay | Admitting: Family Medicine

## 2023-06-17 ENCOUNTER — Other Ambulatory Visit: Payer: Self-pay | Admitting: *Deleted

## 2023-06-17 ENCOUNTER — Encounter: Payer: Self-pay | Admitting: Family Medicine

## 2023-06-17 DIAGNOSIS — J4530 Mild persistent asthma, uncomplicated: Secondary | ICD-10-CM

## 2023-06-17 DIAGNOSIS — K219 Gastro-esophageal reflux disease without esophagitis: Secondary | ICD-10-CM

## 2023-06-17 DIAGNOSIS — J4520 Mild intermittent asthma, uncomplicated: Secondary | ICD-10-CM

## 2023-06-17 MED ORDER — PULMICORT FLEXHALER 90 MCG/ACT IN AEPB: 1.0000 | INHALATION_SPRAY | Freq: Two times a day (BID) | RESPIRATORY_TRACT | 11 refills | Status: DC

## 2023-06-17 MED ORDER — LANSOPRAZOLE 15 MG PO CPDR: 15.0000 mg | DELAYED_RELEASE_CAPSULE | Freq: Every day | ORAL | 3 refills | Status: DC

## 2023-06-17 MED ORDER — ALBUTEROL SULFATE HFA 108 (90 BASE) MCG/ACT IN AERS: 2.0000 | INHALATION_SPRAY | RESPIRATORY_TRACT | 5 refills | Status: DC | PRN

## 2023-10-18 ENCOUNTER — Encounter: Payer: Self-pay | Admitting: Nurse Practitioner

## 2023-10-18 DIAGNOSIS — R399 Unspecified symptoms and signs involving the genitourinary system: Secondary | ICD-10-CM

## 2023-10-19 NOTE — Progress Notes (Signed)
Because male UTIs are uncommon we would need to have you see someone in person who can test your urine and provide you an accurate diagnosis.   I feel your condition warrants further evaluation and I recommend that you be seen for a face to face visit.  Please contact your primary care physician practice to be seen. You may also follow up at an urgent care for urine and possible STD exposure.   NOTE: You will NOT be charged for this eVisit.  If you do not have a PCP, [facility] offers a free physician referral service available at [phone]. Our trained staff has the experience, knowledge and resources to put you in touch with a physician who is right for you.    If you are having a true medical emergency please call 911.   Your e-visit answers were reviewed by a board certified advanced clinical practitioner to complete your personal care plan.  Thank you for using e-Visits.

## 2023-10-19 NOTE — Progress Notes (Signed)
 I have spent 5 minutes in review of e-visit questionnaire, review and updating patient chart, medical decision making and response to patient.   Claiborne Rigg, NP

## 2023-12-03 ENCOUNTER — Encounter: Payer: Self-pay | Admitting: Family Medicine

## 2023-12-04 ENCOUNTER — Ambulatory Visit: Payer: No Typology Code available for payment source | Admitting: Family Medicine

## 2024-01-08 ENCOUNTER — Ambulatory Visit: Admitting: Family Medicine

## 2024-03-02 ENCOUNTER — Ambulatory Visit: Admitting: Family Medicine

## 2024-03-02 VITALS — BP 120/73 | HR 80 | Temp 98.2°F | Ht 66.5 in | Wt 175.0 lb

## 2024-03-02 DIAGNOSIS — Z91030 Bee allergy status: Secondary | ICD-10-CM

## 2024-03-02 DIAGNOSIS — H9201 Otalgia, right ear: Secondary | ICD-10-CM

## 2024-03-02 DIAGNOSIS — Z13220 Encounter for screening for lipoid disorders: Secondary | ICD-10-CM

## 2024-03-02 DIAGNOSIS — J4530 Mild persistent asthma, uncomplicated: Secondary | ICD-10-CM

## 2024-03-02 DIAGNOSIS — J4520 Mild intermittent asthma, uncomplicated: Secondary | ICD-10-CM

## 2024-03-02 DIAGNOSIS — K219 Gastro-esophageal reflux disease without esophagitis: Secondary | ICD-10-CM

## 2024-03-02 LAB — LIPID PANEL

## 2024-03-02 MED ORDER — PULMICORT FLEXHALER 90 MCG/ACT IN AEPB
1.0000 | INHALATION_SPRAY | Freq: Two times a day (BID) | RESPIRATORY_TRACT | 11 refills | Status: AC
Start: 2024-03-02 — End: ?

## 2024-03-02 MED ORDER — OLOPATADINE HCL 0.1 % OP SOLN: 1.0000 [drp] | Freq: Two times a day (BID) | OPHTHALMIC | 12 refills | Status: AC

## 2024-03-02 MED ORDER — ALBUTEROL SULFATE HFA 108 (90 BASE) MCG/ACT IN AERS: 2.0000 | INHALATION_SPRAY | RESPIRATORY_TRACT | 5 refills | Status: AC | PRN

## 2024-03-02 MED ORDER — FEXOFENADINE-PSEUDOEPHED ER 180-240 MG PO TB24: 1.0000 | ORAL_TABLET | Freq: Every evening | ORAL | 11 refills | Status: AC

## 2024-03-02 MED ORDER — BETAMETHASONE SOD PHOS & ACET 6 (3-3) MG/ML IJ SUSP
6.0000 mg | Freq: Once | INTRAMUSCULAR | Status: AC
Start: 2024-03-02 — End: ?

## 2024-03-02 MED ORDER — LANSOPRAZOLE 15 MG PO CPDR: 15.0000 mg | DELAYED_RELEASE_CAPSULE | Freq: Every day | ORAL | 3 refills | Status: AC

## 2024-03-02 MED ORDER — EPINEPHRINE 0.3 MG/0.3ML IJ SOAJ: 0.3000 mg | INTRAMUSCULAR | 3 refills | Status: AC | PRN

## 2024-03-02 NOTE — Progress Notes (Signed)
 Subjective:  Patient ID: Jacob Walker, male    DOB: 2001/06/03  Age: 23 y.o. MRN: [id number]  CC: Numbness (Legs go numb when he sits certain ways. Started one month ago. No painful. ) and Sinus Problem (Nose is always stopped up. Pt can feel pressure and it causes headaches. Not taking anything for it. )   HPI Jacob Walker presents for 2 concerns.  First he notes that when he sits certain ways that he will feel numbness and tingling like bee sting tingling in his legs.  It is an equal amount in each leg.  He has some sensation remaining and he can walk on the legs without loss of balance.  It is momentary.  It is restored quickly.  It seems to occur mainly when he leans forward and puts his hands on his thighs near the knees and puts his weight on that.  Or when he crosses his legs or similar.  It is a recent onset it is mild and minor.  He has lost no function.  Symptoms include congestion, facial pain, nasal congestion, non productive cough, post nasal drip and sinus pressure. There is no fever, chills, or sweats. Onset of symptoms was a few days ago, gradually worsening since that time.  He occasionally gets a headache from this which is more of a pressure type tension headache that is relieved with over-the-counter acetaminophen..  He has a history of asthma and there does seem to be an allergy component.  He uses an inhaler periodically and is in need of refills for his EpiPen  due to bee allergy   Patient in for follow-up of GERD. Currently asymptomatic taking  PPI daily. There is no chest pain or heartburn. No hematemesis and no melena. No dysphagia or choking. Onset is remote. Progression is stable. Complicating factors, none.      03/02/2024    1:16 PM 06/05/2023    1:34 PM 06/05/2023    1:13 PM  Depression screen PHQ 2/9  Decreased Interest 0 0 0  Down, Depressed, Hopeless 0 0 0  PHQ - 2 Score 0 0 0  Altered sleeping 0 0   Tired, decreased energy 0 2   Change in appetite 0 1    Feeling bad or failure about yourself  0 0   Trouble concentrating 0 1   Moving slowly or fidgety/restless 0 0   Suicidal thoughts 0 0   PHQ-9 Score 0 4   Difficult doing work/chores Not difficult at all Not difficult at all     History Jacob Walker has a past medical history of Asthma, GERD (gastroesophageal reflux disease), and Rapid heart beat.   He has no past surgical history on file.   His family history includes Allergies in his mother; Anemia in his mother; Asthma in his mother; Heart failure in his mother.He reports that he has never smoked. He has been exposed to tobacco smoke. He has never used smokeless tobacco. He reports that he does not drink alcohol and does not use drugs.    ROS Review of Systems  Constitutional:  Negative for activity change, appetite change, chills and fever.  HENT:  Positive for congestion, ear pain (On the right side), postnasal drip, rhinorrhea and sinus pressure. Negative for ear discharge, hearing loss, nosebleeds, sneezing and trouble swallowing.   Respiratory:  Negative for chest tightness and shortness of breath.   Cardiovascular:  Negative for chest pain and palpitations.  Musculoskeletal:  Negative for arthralgias.  Skin:  Negative  for rash.    Objective:  BP 120/73   Pulse 80   Temp 98.2 F (36.8 C)   Ht 5' 6.5 (1.689 m)   Wt 175 lb (79.4 kg)   SpO2 96%   BMI 27.82 kg/m   BP Readings from Last 3 Encounters:  03/02/24 120/73  06/05/23 (!) 106/59  11/22/21 120/67    Wt Readings from Last 3 Encounters:  03/02/24 175 lb (79.4 kg)  06/05/23 177 lb 9.6 oz (80.6 kg)  11/22/21 161 lb 8 oz (73.3 kg)     Physical Exam Vitals reviewed.  Constitutional:      Appearance: He is well-developed.  HENT:     Head: Normocephalic and atraumatic.     Right Ear: Tympanic membrane and external ear normal. No decreased hearing noted.     Left Ear: Tympanic membrane and external ear normal. No decreased hearing noted.     Nose: Mucosal  edema present.     Right Sinus: No frontal sinus tenderness.     Left Sinus: No frontal sinus tenderness.     Mouth/Throat:     Pharynx: No oropharyngeal exudate or posterior oropharyngeal erythema.  Eyes:     Pupils: Pupils are equal, round, and reactive to light.  Neck:     Meningeal: Brudzinski's sign absent.  Cardiovascular:     Rate and Rhythm: Normal rate and regular rhythm.     Heart sounds: No murmur heard. Pulmonary:     Effort: No respiratory distress.     Breath sounds: Normal breath sounds.  Musculoskeletal:     Cervical back: Normal range of motion and neck supple.  Lymphadenopathy:     Head:     Right side of head: No preauricular adenopathy.     Left side of head: No preauricular adenopathy.     Cervical:     Right cervical: No superficial cervical adenopathy.    Left cervical: No superficial cervical adenopathy.  Neurological:     Mental Status: He is alert and oriented to person, place, and time.      Assessment & Plan:  Lipid screening -     CBC with Differential/Platelet -     CMP14+EGFR -     Lipid panel  Mild intermittent asthma without complication -     Albuterol  Sulfate HFA; Inhale 2 puffs into the lungs every 4 (four) hours as needed for wheezing or shortness of breath (shortness of breath, wheezing).  Dispense: 18 g; Refill: 5  Rhinitis, allergic, with asthma, without status asthmaticus, mild intermittent, uncomplicated -     CBC with Differential/Platelet -     CMP14+EGFR -     Lipid panel -     Urinalysis -     Betamethasone  Sod Phos & Acet -     Albuterol  Sulfate HFA; Inhale 2 puffs into the lungs every 4 (four) hours as needed for wheezing or shortness of breath (shortness of breath, wheezing).  Dispense: 18 g; Refill: 5  Acute otalgia, right  Mild persistent asthma without complication -     CBC with Differential/Platelet -     CMP14+EGFR -     Lipid panel -     Urinalysis -     Betamethasone  Sod Phos & Acet -     Pulmicort   Flexhaler; Inhale 1-2 puffs into the lungs 2 (two) times daily.  Dispense: 1 each; Refill: 11  Gastroesophageal reflux disease without esophagitis -     CBC with Differential/Platelet -     CMP14+EGFR -  Lipid panel -     Urinalysis -     Betamethasone  Sod Phos & Acet -     Lansoprazole ; Take 1 capsule (15 mg total) by mouth daily.  Dispense: 90 capsule; Refill: 3  Bee sting allergy -     EPINEPHrine ; Inject 0.3 mg into the muscle as needed for anaphylaxis.  Dispense: 2 each; Refill: 3  Other orders -     Fexofenadine -Pseudoephed ER; Take 1 tablet by mouth every evening. For allergy and congestion  Dispense: 30 tablet; Refill: 11 -     Olopatadine  HCl; Place 1 drop into both eyes 2 (two) times daily.  Dispense: 5 mL; Refill: 12     Follow-up: Return in about 1 year (around 03/02/2025) for [facility] physical.  Butler Der, M.D.

## 2024-03-03 LAB — CMP14+EGFR
ALT: 12 IU/L (ref 0–44)
AST: 17 IU/L (ref 0–40)
Albumin: 4.8 g/dL (ref 4.3–5.2)
Alkaline Phosphatase: 102 IU/L (ref 44–121)
BUN/Creatinine Ratio: 11 (ref 9–20)
BUN: 12 mg/dL (ref 6–20)
Bilirubin Total: 0.7 mg/dL (ref 0.0–1.2)
CO2: 23 mmol/L (ref 20–29)
Calcium: 9.7 mg/dL (ref 8.7–10.2)
Chloride: 100 mmol/L (ref 96–106)
Creatinine, Ser: 1.11 mg/dL (ref 0.76–1.27)
Globulin, Total: 2.5 g/dL (ref 1.5–4.5)
Glucose: 91 mg/dL (ref 70–99)
Potassium: 4.3 mmol/L (ref 3.5–5.2)
Sodium: 139 mmol/L (ref 134–144)
Total Protein: 7.3 g/dL (ref 6.0–8.5)
eGFR: 96 mL/min/{1.73_m2} (ref >59)

## 2024-03-03 LAB — CBC WITH DIFFERENTIAL/PLATELET
Basophils Absolute: 0 x10E3/uL (ref 0.0–0.2)
Basos: 0 %
EOS (ABSOLUTE): 0.1 10*3/uL (ref 0.0–0.4)
Eos: 2 %
Hematocrit: 45.9 % (ref 37.5–51.0)
Hemoglobin: 15.3 g/dL (ref 13.0–17.7)
Immature Grans (Abs): 0 10*3/uL (ref 0.0–0.1)
Immature Granulocytes: 0 %
Lymphocytes Absolute: 1.1 10*3/uL (ref 0.7–3.1)
Lymphs: 30 %
MCH: 32.3 pg (ref 26.6–33.0)
MCHC: 33.3 g/dL (ref 31.5–35.7)
MCV: 97 fL (ref 79–97)
Monocytes Absolute: 0.3 x10E3/uL (ref 0.1–0.9)
Monocytes: 9 %
Neutrophils Absolute: 2 10*3/uL (ref 1.4–7.0)
Neutrophils: 59 %
Platelets: 264 x10E3/uL (ref 150–450)
RBC: 4.73 x10E6/uL (ref 4.14–5.80)
RDW: 10.9 % — ABNORMAL LOW (ref 11.6–15.4)
WBC: 3.5 x10E3/uL (ref 3.4–10.8)

## 2024-03-03 LAB — LIPID PANEL
Cholesterol, Total: 113 mg/dL (ref 100–199)
HDL: 47 mg/dL (ref >39)
LDL CALC COMMENT:: 2.4 ratio (ref 0.0–5.0)
LDL Chol Calc (NIH): 54 mg/dL (ref 0–99)
Triglycerides: 53 mg/dL (ref 0–149)
VLDL Cholesterol Cal: 12 mg/dL (ref 5–40)

## 2024-03-11 ENCOUNTER — Ambulatory Visit: Payer: Self-pay | Admitting: Family Medicine

## 2024-03-11 NOTE — Progress Notes (Signed)
 Hello Spiro,    Your lab result is normal and/or stable.Some minor variations that are not significant are commonly marked abnormal, but do not represent any medical problem for you.  Best regards, Butler Der, M.D.

## 2024-04-26 ENCOUNTER — Encounter: Payer: Self-pay | Admitting: Family Medicine

## 2024-05-31 ENCOUNTER — Ambulatory Visit: Admitting: Family Medicine

## 2024-06-07 ENCOUNTER — Encounter: Payer: Self-pay | Admitting: Family Medicine

## 2024-06-07 ENCOUNTER — Encounter: Admitting: Family Medicine

## 2024-06-28 ENCOUNTER — Telehealth: Payer: Self-pay | Admitting: Physician Assistant

## 2024-06-28 DIAGNOSIS — B9789 Other viral agents as the cause of diseases classified elsewhere: Secondary | ICD-10-CM | POA: Diagnosis not present

## 2024-06-28 DIAGNOSIS — J019 Acute sinusitis, unspecified: Secondary | ICD-10-CM

## 2024-06-28 MED ORDER — FLUTICASONE PROPIONATE 50 MCG/ACT NA SUSP: 2.0000 | Freq: Every day | NASAL | 0 refills | Status: AC

## 2024-06-28 NOTE — Progress Notes (Signed)
 We are sorry that you are not feeling well.  Here is how we plan to help!  Based on what you have shared with me it looks like you have sinusitis.  Sinusitis is inflammation and infection in the sinus cavities of the head.  Based on your presentation I believe you most likely have Acute Viral Sinusitis.This is an infection most likely caused by a virus. There is not specific treatment for viral sinusitis other than to help you with the symptoms until the infection runs its course.  You may use an oral decongestant such as Mucinex D or if you have glaucoma or high blood pressure use plain Mucinex. Saline nasal spray help and can safely be used as often as needed for congestion, I have prescribed: Fluticasone nasal spray two sprays in each nostril once a day  Some authorities believe that zinc sprays or the use of Echinacea may shorten the course of your symptoms.  Sinus infections are not as easily transmitted as other respiratory infection, however we still recommend that you avoid close contact with loved ones, especially the very young and elderly.  Remember to wash your hands thoroughly throughout the day as this is the number one way to prevent the spread of infection!  Home Care: Only take medications as instructed by your medical team. Do not take these medications with alcohol. A steam or ultrasonic humidifier can help congestion.  You can place a towel over your head and breathe in the steam from hot water coming from a faucet. Avoid close contacts especially the very young and the elderly. Cover your mouth when you cough or sneeze. Always remember to wash your hands.  Get Help Right Away If: You develop worsening fever or sinus pain. You develop a severe head ache or visual changes. Your symptoms persist after you have completed your treatment plan.  Make sure you Understand these instructions. Will watch your condition. Will get help right away if you are not doing well or get  worse.  Your e-visit answers were reviewed by a board certified advanced clinical practitioner to complete your personal care plan.  Depending on the condition, your plan could have included both over the counter or prescription medications.  If there is a problem please reply  once you have received a response from your provider.  Your safety is important to us .  If you have drug allergies check your prescription carefully.    You can use MyChart to ask questions about today's visit, request a non-urgent call back, or ask for a work or school excuse for 24 hours related to this e-Visit. If it has been greater than 24 hours you will need to follow up with your provider, or enter a new e-Visit to address those concerns.  You will get an e-mail in the next two days asking about your experience.  I hope that your e-visit has been valuable and will speed your recovery. Thank you for using e-visits.  I have spent 5 minutes in review of e-visit questionnaire, review and updating patient chart, medical decision making and response to patient.   Jacob CHRISTELLA Dickinson, PA-C
